# Patient Record
Sex: Male | Born: 2005 | Race: Black or African American | Hispanic: No
Health system: Southern US, Community
[De-identification: ages and names within clinical notes are randomized; demographics above are authoritative.]

## PROBLEM LIST (undated history)

## (undated) DIAGNOSIS — L309 Dermatitis, unspecified: Secondary | ICD-10-CM

## (undated) DIAGNOSIS — J45909 Unspecified asthma, uncomplicated: Secondary | ICD-10-CM

---
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
## 2023-05-24 ENCOUNTER — Emergency Department (HOSPITAL_COMMUNITY): Payer: BC Managed Care – PPO

## 2023-05-24 ENCOUNTER — Encounter (HOSPITAL_COMMUNITY): Payer: Self-pay

## 2023-05-24 ENCOUNTER — Emergency Department (HOSPITAL_COMMUNITY)
Admission: EM | Admit: 2023-05-24 | Discharge: 2023-05-24 | Disposition: A | Discharge status: Home / Self Care | Payer: BC Managed Care – PPO | Attending: Emergency Medicine | Admitting: Emergency Medicine

## 2023-05-24 ENCOUNTER — Other Ambulatory Visit: Payer: Self-pay

## 2023-05-24 DIAGNOSIS — J189 Pneumonia, unspecified organism: Secondary | ICD-10-CM | POA: Diagnosis not present

## 2023-05-24 DIAGNOSIS — M791 Myalgia, unspecified site: Secondary | ICD-10-CM | POA: Insufficient documentation

## 2023-05-24 DIAGNOSIS — Z79899 Other long term (current) drug therapy: Secondary | ICD-10-CM | POA: Insufficient documentation

## 2023-05-24 DIAGNOSIS — J45909 Unspecified asthma, uncomplicated: Secondary | ICD-10-CM | POA: Diagnosis not present

## 2023-05-24 DIAGNOSIS — Z91010 Allergy to peanuts: Secondary | ICD-10-CM | POA: Insufficient documentation

## 2023-05-24 DIAGNOSIS — Z1152 Encounter for screening for COVID-19: Secondary | ICD-10-CM | POA: Insufficient documentation

## 2023-05-24 DIAGNOSIS — Z7951 Long term (current) use of inhaled steroids: Secondary | ICD-10-CM | POA: Insufficient documentation

## 2023-05-24 DIAGNOSIS — R059 Cough, unspecified: Secondary | ICD-10-CM | POA: Diagnosis present

## 2023-05-24 LAB — RESP PANEL BY RT-PCR (RSV, FLU A&B, COVID)  RVPGX2
Influenza A by PCR: NEGATIVE
Influenza B by PCR: NEGATIVE
Resp Syncytial Virus by PCR: NEGATIVE
SARS Coronavirus 2 by RT PCR: NEGATIVE

## 2023-05-24 MED ORDER — AMOXICILLIN-POT CLAVULANATE 875-125 MG PO TABS
1.0000 | ORAL_TABLET | Freq: Once | ORAL | Status: AC
  Administered 2023-05-24: 1 via ORAL
  Filled 2023-05-24: qty 1

## 2023-05-24 MED ORDER — AZITHROMYCIN 250 MG PO TABS
500.0000 mg | ORAL_TABLET | Freq: Once | ORAL | Status: AC
  Administered 2023-05-24: 500 mg via ORAL
  Filled 2023-05-24: qty 2

## 2023-05-24 MED ORDER — ACETAMINOPHEN 325 MG PO TABS
650.0000 mg | ORAL_TABLET | Freq: Once | ORAL | Status: AC
  Administered 2023-05-24: 650 mg via ORAL
  Filled 2023-05-24: qty 2

## 2023-05-24 MED ORDER — ALBUTEROL SULFATE HFA 108 (90 BASE) MCG/ACT IN AERS
1.0000 | INHALATION_SPRAY | Freq: Once | RESPIRATORY_TRACT | Status: AC
  Administered 2023-05-24: 2 via RESPIRATORY_TRACT
  Filled 2023-05-24: qty 6.7

## 2023-05-24 NOTE — ED Triage Notes (Signed)
Patient reports cough, fever, and congestion x 1 week. Went to urgent care. Patient just received results for x ray while in triage. Showed pneumonia and was prescribed abx.

## 2023-05-24 NOTE — ED Notes (Signed)
Pt provided discharge instructions and prescription information. Pt was given the opportunity to ask questions and questions were answered.   

## 2023-05-24 NOTE — ED Provider Notes (Signed)
[place] EMERGENCY DEPARTMENT AT [facility] Provider Note   CSN: [id number] Arrival date & time: 05/24/23  1506     History  Chief Complaint  Patient presents with   Cough    Joshua Day is a 17 y.o. male past medical history significant for asthma presents today for shortness of breath.  Patient was diagnosed today with pneumonia urgent care and he has yet to pick up his antibiotics.  Patient endorses body aches, productive cough with mucus and intermittently blood-tinged.  Patient endorses dyspnea with exertion, fever, nausea, vomiting x 1, and headache.  Patient denies diarrhea, abdominal pain, hematuria, or dysuria.   Cough Associated symptoms: fever, headaches, myalgias and shortness of breath        Home Medications Prior to Admission medications   Medication Sig Start Date End Date Taking? Authorizing Provider  albuterol (PROVENTIL HFA;VENTOLIN HFA) 108 (90 BASE) MCG/ACT inhaler Inhale 2 puffs into the lungs every 4 (four) hours as needed. For shortness of breath    [provider]  aluminum chloride (DRYSOL) 20 % external solution Apply to armpits at bedtime then wash off in the morning with plain water 06/06/18   [provider]  beclomethasone (QVAR REDIHALER) 80 MCG/ACT inhaler 2 pufs    [provider]  beclomethasone (QVAR) 40 MCG/ACT inhaler Inhale 2 puffs into the lungs 2 (two) times daily.    [provider]  Cetirizine HCl (ZYRTEC) 5 MG/5ML SYRP Take 10 mg by mouth at bedtime. 10mg =2tsp    [provider]  ciprofloxacin (CILOXAN) 0.3 % ophthalmic solution 1-2 drops while awake 03/31/19   [provider]  fluticasone (FLONASE) 50 MCG/ACT nasal spray Place 2 sprays into the nose daily as needed. For nasal congestion    [provider]  loratadine (CLARITIN) 10 MG tablet 1 tablet    [provider]  mometasone (ELOCON) 0.1 % cream Apply 1 application topically daily.    [provider]  mometasone (NASONEX) 50 MCG/ACT nasal spray USE 1 SPRAY IN EACH NOSTRIL EVERY DAY FOR STUFFY NOSE OR DRAINAGE 06/17/15   Baxter Hire, MD  mupirocin ointment (BACTROBAN) 2 % 1 application to affected area 02/08/19   [provider]  predniSONE (DELTASONE) 20 MG tablet Take 2 tablets (40 mg total) by mouth daily. 04/08/14   Antony Madura, PA-C  terbinafine (LAMISIL) 250 MG tablet Take 1 tablet (250 mg total) by mouth daily. 07/15/20   Candelaria Stagers, DPM  terbinafine (LAMISIL) 250 MG tablet Take 1 tablet (250 mg total) by mouth daily. 07/23/20   Candelaria Stagers, DPM  tretinoin (RETIN-A) 0.01 % gel 1 application to affected area in the evening to face 11/21/17   [provider]  triamcinolone ointment (KENALOG) 0.1 % apply    [provider]      Allergies    Peanut-containing drug products    Review of Systems   Review of Systems  Constitutional:  Positive for fever.  Respiratory:  Positive for cough and shortness of breath.   Gastrointestinal:  Positive for nausea and vomiting.  Musculoskeletal:  Positive for arthralgias and myalgias.  Neurological:  Positive for headaches.    Physical Exam Updated Vital Signs BP 104/82 (BP Location: Left Arm)   Pulse 93   Temp 99 F (37.2 C) (Oral)   Resp 18   Ht 5\' 10"  (1.778 m)   Wt 114.3 kg   SpO2 99%   BMI 36.16 kg/m  Physical Exam Vitals  and nursing note reviewed.  Constitutional:      General: He is not in acute distress.    Appearance: He is well-developed.  HENT:     Head: Normocephalic and atraumatic.     Right Ear: External ear normal.     Left Ear: External ear normal.     Nose: Nose normal.  Eyes:     Conjunctiva/sclera: Conjunctivae normal.     Pupils: Pupils are equal, round, and reactive to light.  Cardiovascular:     Rate and Rhythm: Normal rate and regular rhythm.     Heart sounds: No murmur heard. Pulmonary:     Effort: Pulmonary effort is normal. No respiratory distress.      Comments: Fine crackles in lower left lobe.  Few wheezes in bilateral upper lobes. Abdominal:     Palpations: Abdomen is soft.     Tenderness: There is no abdominal tenderness.  Musculoskeletal:        General: No swelling. Normal range of motion.     Cervical back: Neck supple.  Skin:    General: Skin is warm and dry.     Capillary Refill: Capillary refill takes less than 2 seconds.  Neurological:     General: No focal deficit present.     Mental Status: He is alert.     Motor: No weakness.  Psychiatric:        Mood and Affect: Mood normal.     ED Results / Procedures / Treatments   Labs (all labs ordered are listed, but only abnormal results are displayed) Labs Reviewed  RESP PANEL BY RT-PCR (RSV, FLU A&B, COVID)  [id number]    EKG None  Radiology DG Chest 2 View  Result Date: 05/24/2023 CLINICAL DATA:  Productive cough, shortness of breath, and chest pain for the past 3 days. EXAM: CHEST - 2 VIEW COMPARISON:  Chest x-ray from same day. Chest x-ray dated April 11, 2011. FINDINGS: The heart size and mediastinal contours are within normal limits. Normal pulmonary vascularity. Unchanged low lung volumes with left lower lobe consolidation. No pleural effusion or pneumothorax. No acute osseous abnormality. IMPRESSION: 1. Unchanged left lower lobe pneumonia. Electronically Signed   By: Obie Dredge M.D.   On: 05/24/2023 18:19    Procedures Procedures    Medications Ordered in ED Medications  albuterol (VENTOLIN HFA) 108 (90 Base) MCG/ACT inhaler 1-2 puff (has no administration in time range)  acetaminophen (TYLENOL) tablet 650 mg (650 mg Oral Given 05/24/23 1532)    ED Course/ Medical Decision Making/ A&P                                 Medical Decision Making Amount and/or Complexity of Data Reviewed Radiology: ordered.  Risk OTC drugs.   This patient presents to the ED with chief complaint(s) of shortness of breath with pertinent past medical history of asthma  and pneumonia which further complicates the presenting complaint. The complaint involves an extensive differential diagnosis and also carries with it a high risk of complications and morbidity.    The differential diagnosis includes pneumonia, upper respiratory illness, COVID, flu, RSV  Additional history obtained: Additional history obtained from family  ED Course and Reassessment: Albuterol MDI  Independent labs interpretation:  The following labs were independently interpreted:  Respiratory panel: Negative EKG: Sinus  Independent visualization of imaging: - I independently visualized the following imaging with scope of interpretation limited to determining acute life  threatening conditions related to emergency care: Chest x-ray, which revealed left lower lobe pneumonia  Consultation: - Consulted or discussed management/test interpretation w/ external professional: None  Consideration for admission or further workup: Considered for admission or further workup however patient's vital signs and physical exam is been reassuring patient imaging showed lower lobe pneumonia for which he has been prescribed antibiotics by urgent care.  Patient was given albuterol inhaler while in ER and can continue taking this inhaler at home.        Final Clinical Impression(s) / ED Diagnoses Final diagnoses:  Community acquired pneumonia of left lung, unspecified part of lung    Rx / DC Orders ED Discharge Orders     None         Gretta Began 05/24/23 2008    Long, Joshua G, MD 05/24/23 [phone]

## 2023-05-24 NOTE — Discharge Instructions (Signed)
Today you were seen for pneumonia.  Please pick up your medication and take as prescribed.  Please follow-up with your PCP in approximately 1 week thank you for letting us treat you today. After reviewing your labs and imaging, I feel you are safe to go home. Please follow up with your PCP in the next several days and provide them with your records from this visit. Return to the Emergency Room if pain becomes severe or symptoms worsen.

## 2023-05-24 NOTE — ED Provider Triage Note (Signed)
Emergency Medicine Provider Triage Evaluation Note  Joshua Day , a 17 y.o. male  was evaluated in triage.  Pt complains of SOB.  Review of Systems  Positive: Body aches, productive cough w/ mucus and blood, SOB with exertion, fever, N/V, HA Negative: [place], ABD pain, hematuria, dysuria  Physical Exam  BP 113/70 (BP Location: Right Arm)   Pulse (!) 109   Temp (!) 102.1 F (38.9 C) (Oral)   Resp 17   Ht 5\' 10"  (1.778 m)   Wt 114.3 kg   SpO2 96%   BMI 36.16 kg/m  Gen:   Awake, no distress   Resp:  Normal effort  MSK:   Moves extremities without difficulty  Other:  Hx: pneumonia and asthma   Medical Decision Making  Medically screening exam initiated at 4:13 PM.  Appropriate orders placed.  ISAIH GENTER was informed that the remainder of the evaluation will be completed by another provider, this initial triage assessment does not replace that evaluation, and the importance of remaining in the ED until their evaluation is complete.  CXR, Resp panel   Dolphus Jenny, PA-C 05/24/23 1621

## 2023-07-11 ENCOUNTER — Encounter: Payer: Self-pay | Admitting: Podiatry

## 2023-07-11 ENCOUNTER — Ambulatory Visit (INDEPENDENT_AMBULATORY_CARE_PROVIDER_SITE_OTHER): Payer: No Typology Code available for payment source | Admitting: Podiatry

## 2023-07-11 ENCOUNTER — Ambulatory Visit (INDEPENDENT_AMBULATORY_CARE_PROVIDER_SITE_OTHER): Payer: No Typology Code available for payment source

## 2023-07-11 DIAGNOSIS — M778 Other enthesopathies, not elsewhere classified: Secondary | ICD-10-CM | POA: Diagnosis not present

## 2023-07-11 DIAGNOSIS — M722 Plantar fascial fibromatosis: Secondary | ICD-10-CM | POA: Diagnosis not present

## 2023-07-11 DIAGNOSIS — Q666 Other congenital valgus deformities of feet: Secondary | ICD-10-CM

## 2023-07-11 DIAGNOSIS — Q742 Other congenital malformations of lower limb(s), including pelvic girdle: Secondary | ICD-10-CM

## 2023-07-11 MED ORDER — MELOXICAM 15 MG PO TABS: 15.0000 mg | ORAL_TABLET | Freq: Every day | ORAL | 0 refills | Status: AC

## 2023-07-11 NOTE — Patient Instructions (Signed)

## 2023-07-11 NOTE — Progress Notes (Signed)
Subjective:   Patient ID: Joshua Day, male   DOB: 19 y.o.   MRN: [id number]   HPI Chief Complaint  Patient presents with   Foot Pain    RM#13 Bilateral foot pain for several months now has taken ibuprofen hasn't relieved much. Walking and standing for long periods of times makes it worse.   18 year old male presents with his dad for concerns of bilateral foot pain mostly the particular the right side worse than left.  He does not report any recent injuries.  Most of the discomfort is along the bottom of the arch of the foot where he points.  No swelling.  No recent treatment otherwise.  No other concerns.   Review of Systems  All other systems reviewed and are negative.  Past Medical History:  Diagnosis Date   Asthma    Eczema     History reviewed. No pertinent surgical history.   Current Outpatient Medications:    albuterol (PROVENTIL HFA;VENTOLIN HFA) 108 (90 BASE) MCG/ACT inhaler, Inhale 2 puffs into the lungs every 4 (four) hours as needed. For shortness of breath, Disp: , Rfl:    aluminum chloride (DRYSOL) 20 % external solution, Apply to armpits at bedtime then wash off in the morning with plain water, Disp: , Rfl:    beclomethasone (QVAR REDIHALER) 80 MCG/ACT inhaler, 2 pufs, Disp: , Rfl:    beclomethasone (QVAR) 40 MCG/ACT inhaler, Inhale 2 puffs into the lungs 2 (two) times daily., Disp: , Rfl:    Cetirizine HCl (ZYRTEC) 5 MG/5ML SYRP, Take 10 mg by mouth at bedtime. 10mg =2tsp, Disp: , Rfl:    ciprofloxacin (CILOXAN) 0.3 % ophthalmic solution, 1-2 drops while awake, Disp: , Rfl:    fluticasone (FLONASE) 50 MCG/ACT nasal spray, Place 2 sprays into the nose daily as needed. For nasal congestion, Disp: , Rfl:    loratadine (CLARITIN) 10 MG tablet, 1 tablet, Disp: , Rfl:    meloxicam (MOBIC) 15 MG tablet, Take 1 tablet (15 mg total) by mouth daily., Disp: 30 tablet, Rfl: 0   mometasone (ELOCON) 0.1 % cream, Apply 1 application topically daily., Disp: , Rfl:    mometasone  (NASONEX) 50 MCG/ACT nasal spray, USE 1 SPRAY IN EACH NOSTRIL EVERY DAY FOR STUFFY NOSE OR DRAINAGE, Disp: 17 g, Rfl: 5   mupirocin ointment (BACTROBAN) 2 %, 1 application to affected area, Disp: , Rfl:    predniSONE (DELTASONE) 20 MG tablet, Take 2 tablets (40 mg total) by mouth daily., Disp: 8 tablet, Rfl: 0   terbinafine (LAMISIL) 250 MG tablet, Take 1 tablet (250 mg total) by mouth daily., Disp: 90 tablet, Rfl: 0   terbinafine (LAMISIL) 250 MG tablet, Take 1 tablet (250 mg total) by mouth daily., Disp: 90 tablet, Rfl: 0   tretinoin (RETIN-A) 0.01 % gel, 1 application to affected area in the evening to face, Disp: , Rfl:    triamcinolone ointment (KENALOG) 0.1 %, apply, Disp: , Rfl:   Allergies  Allergen Reactions   Peanut-Containing Drug Products Hives    Allergic to all nuts           Objective:  Physical Exam  General: AAO x3, NAD  Dermatological: Skin is warm, dry and supple bilateral. There are no open sores, no preulcerative lesions, no rash or signs of infection present.  Vascular: Dorsalis Pedis artery and Posterior Tibial artery pedal pulses are 2/4 bilateral with immedate capillary fill time. There is no pain with calf compression, swelling, warmth, erythema.   Neruologic: Grossly intact  via light touch bilateral  Musculoskeletal: Decreased medial arch upon weightbearing bilaterally.  Tenderness noted on the arch of the foot on the medial band plantar fascia.  On the right foot is congenital abnormality with the fifth ray, part of the cuboid not present.  There is no area pinpoint tenderness.  Flexor, extensor tendons.  Intact.  Gait: Unassisted, Nonantalgic.       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  3 views bilateral feet were obtained.  No  evidence of acute fracture.  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  -Ultimately discussed inserts to help support the arch of his foot is seen today by Nicki Guadalajara, pedorthist to help multiple and measured for inserts. -We discussed stretching, icing a regular basis.  Discussed supportive shoe gear. -Prescribed mobic. Discussed side effects of the medication and directed to stop if any are to occur and call the office.  -Consider formal PT if needed  No follow-ups on file.  Vivi Barrack DPM

## 2023-08-22 ENCOUNTER — Ambulatory Visit: Payer: No Typology Code available for payment source

## 2023-08-22 NOTE — Progress Notes (Signed)
 Patient presents today to pick up custom molded foot orthotics, diagnosed with PF by Dr. Ardelle Anton.   Orthotics were dispensed and fit was satisfactory. Reviewed instructions for break-in and wear. Written instructions given to patient.  Patient will follow up as needed.  Addison Bailey CPed, CFo, CFm
